# Patient Record
Sex: Male | Born: 1990 | Race: Black or African American | Hispanic: No | Marital: Single | Smoking: Current every day smoker
Health system: Southern US, Community
[De-identification: ages and names within clinical notes are randomized; demographics above are authoritative.]

---

## 2019-11-01 ENCOUNTER — Emergency Department: Payer: Self-pay

## 2019-11-01 ENCOUNTER — Other Ambulatory Visit: Payer: Self-pay

## 2019-11-01 ENCOUNTER — Encounter: Payer: Self-pay | Admitting: Emergency Medicine

## 2019-11-01 ENCOUNTER — Emergency Department
Admission: EM | Admit: 2019-11-01 | Discharge: 2019-11-01 | Disposition: A | Discharge status: Home / Self Care | Payer: Self-pay | Attending: Emergency Medicine | Admitting: Emergency Medicine

## 2019-11-01 DIAGNOSIS — F17210 Nicotine dependence, cigarettes, uncomplicated: Secondary | ICD-10-CM | POA: Insufficient documentation

## 2019-11-01 DIAGNOSIS — M79674 Pain in right toe(s): Secondary | ICD-10-CM | POA: Insufficient documentation

## 2019-11-01 LAB — URIC ACID: Uric Acid, Serum: 6.6 mg/dL (ref 3.7–8.6)

## 2019-11-01 MED ORDER — NAPROXEN 500 MG PO TABS
500.0000 mg | ORAL_TABLET | Freq: Once | ORAL | Status: AC
  Administered 2019-11-01: 500 mg via ORAL
  Filled 2019-11-01: qty 1

## 2019-11-01 MED ORDER — NAPROXEN 500 MG PO TABS: 500.0000 mg | ORAL_TABLET | Freq: Two times a day (BID) | ORAL | Status: AC

## 2019-11-01 NOTE — ED Triage Notes (Signed)
Pt to triage via w/c with no distress noted; pt reports painful rt great toe with no known injury or hx of same

## 2019-11-01 NOTE — Discharge Instructions (Addendum)
No objective findings for your complaint.  Advised to follow-up with podiatrist.

## 2019-11-01 NOTE — ED Provider Notes (Signed)
[facility] Emergency Department Provider Note   ____________________________________________   First MD Initiated Contact with Patient 11/01/19 [phone]     (approximate)  I have reviewed the triage vital signs and the nursing notes.   HISTORY  Chief Complaint Toe Pain    HPI Jose Barrett is a 29 y.o. male patient presents with 2 days of right great toe pain with no provocative incident.  Patient rates the pain as a 10/10.  Patient scribed pain is "achy".  No palliative measure for complaint.         History reviewed. No pertinent past medical history.  There are no problems to display for this patient.   History reviewed. No pertinent surgical history.  Prior to Admission medications   Medication Sig Start Date End Date Taking? Authorizing Provider  naproxen (NAPROSYN) 500 MG tablet Take 1 tablet (500 mg total) by mouth 2 (two) times daily with a meal. 11/01/19   Joni Reining, PA-C    Allergies Patient has no known allergies.  No family history on file.  Social History Social History   Tobacco Use  . Smoking status: Current Every Day Smoker  . Smokeless tobacco: Never Used  Substance Use Topics  . Alcohol use: Not on file  . Drug use: Not on file    Review of Systems Constitutional: No fever/chills Eyes: No visual changes. ENT: No sore throat. Cardiovascular: Denies chest pain. Respiratory: Denies shortness of breath. Gastrointestinal: No abdominal pain.  No nausea, no vomiting.  No diarrhea.  No constipation. Genitourinary: Negative for dysuria. Musculoskeletal: Great right toe pain.   Skin: Negative for rash. Neurological: Negative for headaches, focal weakness or numbness.   ____________________________________________   PHYSICAL EXAM:  VITAL SIGNS: ED Triage Vitals  Enc Vitals Group     BP 11/01/19 0503 127/65     Pulse Rate 11/01/19 0503 88     Resp 11/01/19 0503 20     Temp 11/01/19 0503 97.9 F (36.6 C)    Temp Source 11/01/19 0503 Oral     SpO2 11/01/19 0503 98 %     Weight --      Height 11/01/19 0503 6' (1.829 m)     Head Circumference --      Peak Flow --      Pain Score 11/01/19 0506 10     Pain Loc --      Pain Edu? --      Excl. in GC? --     Constitutional: Alert and oriented. Well appearing and in no acute distress. Hematological/Lymphatic/Immunilogical: No cervical lymphadenopathy. Cardiovascular: Normal rate, regular rhythm. Grossly normal heart sounds.  Good peripheral circulation. Respiratory: Normal respiratory effort.  No retractions. Lungs CTAB. Musculoskeletal: No obvious deformity or edema to the great right toe.  No erythema.   Neurologic:  Normal speech and language. No gross focal neurologic deficits are appreciated. No gait instability. Skin:  Skin is warm, dry and intact. No rash noted. Psychiatric: Mood and affect are normal. Speech and behavior are normal.  ____________________________________________   LABS (all labs ordered are listed, but only abnormal results are displayed)  Labs Reviewed  URIC ACID   ____________________________________________  EKG   ____________________________________________  RADIOLOGY  ED MD interpretation:    Official radiology report(s): DG Toe Great Right  Result Date: 11/01/2019 CLINICAL DATA:  Right great toe pain. No known injury. EXAM: RIGHT GREAT TOE COMPARISON:  None. FINDINGS: The joint spaces are maintained. No acute bony findings or arthropathic changes.  IMPRESSION: No acute bony findings. Electronically Signed   By: Marijo Sanes M.D.   On: 11/01/2019 06:52    ____________________________________________   PROCEDURES  Procedure(s) performed (including Critical Care):  Procedures   ____________________________________________   INITIAL IMPRESSION / ASSESSMENT AND PLAN / ED COURSE  As part of my medical decision making, I reviewed the following data within the [facility]        Patient presents with atraumatic right great toe pain for 2 days.  Discussed negative x-ray and uric acid results with patient.  Patient given discharge care instruction advised follow podiatry for definitive evaluation and treatment.  Take medication as directed.   Jose Barrett was evaluated in Emergency Department on 11/01/2019 for the symptoms described in the history of present illness. He was evaluated in the context of the global COVID-19 pandemic, which necessitated consideration that the patient might be at risk for infection with the SARS-CoV-2 virus that causes COVID-19. Institutional protocols and algorithms that pertain to the evaluation of patients at risk for COVID-19 are in a state of rapid change based on information released by regulatory bodies including the CDC and federal and state organizations. These policies and algorithms were followed during the patient's care in the ED.       ____________________________________________   FINAL CLINICAL IMPRESSION(S) / ED DIAGNOSES  Final diagnoses:  Great toe pain, right     ED Discharge Orders         Ordered    naproxen (NAPROSYN) 500 MG tablet  2 times daily with meals     11/01/19 0732           Note:  This document was prepared using Dragon voice recognition software and may include unintentional dictation errors.    Sable Feil, PA-C 11/01/19 4128    Carrie Mew, MD 11/01/19 1452

## 2020-01-21 ENCOUNTER — Ambulatory Visit: Payer: Self-pay | Attending: Internal Medicine

## 2020-01-21 ENCOUNTER — Other Ambulatory Visit: Payer: Self-pay

## 2020-01-21 DIAGNOSIS — Z23 Encounter for immunization: Secondary | ICD-10-CM

## 2020-01-21 NOTE — Progress Notes (Signed)
   Covid-19 Vaccination Clinic  Name:  Jose Barrett    MRN: [id number] DOB: 08-17-1990  01/21/2020  Jose Barrett was observed post Covid-19 immunization for 30 minutes based on pre-vaccination screening without incident. He was provided with Vaccine Information Sheet and instruction to access the V-Safe system.   Jose Barrett was instructed to call 911 with any severe reactions post vaccine: Marland Kitchen Difficulty breathing  . Swelling of face and throat  . A fast heartbeat  . A bad rash all over body  . Dizziness and weakness   Immunizations Administered    Name Date Dose VIS Date Route   Pfizer COVID-19 Vaccine 01/21/2020  8:11 AM 0.3 mL 09/07/2018 Intramuscular   Manufacturer: [employer], [employer]   Lot: [id number]   NDC: [address]-1

## 2020-02-07 ENCOUNTER — Ambulatory Visit: Payer: Self-pay | Attending: Internal Medicine

## 2020-02-07 DIAGNOSIS — Z23 Encounter for immunization: Secondary | ICD-10-CM

## 2020-02-07 NOTE — Progress Notes (Signed)
   Covid-19 Vaccination Clinic  Name:  Jose Barrett    MRN: [id number] DOB: 14-Jan-1991  02/07/2020  [name] was observed post Covid-19 immunization for 15 minutes without incident. He was provided with Vaccine Information Sheet and instruction to access the V-Safe system.   [name] was instructed to call 911 with any severe reactions post vaccine: Marland Kitchen Difficulty breathing  . Swelling of face and throat  . A fast heartbeat  . A bad rash all over body  . Dizziness and weakness   Immunizations Administered    Name Date Dose VIS Date Route   Pfizer COVID-19 Vaccine 02/07/2020  3:42 PM 0.3 mL 09/07/2018 Intramuscular   Manufacturer: [employer], [employer]   Lot: [id number]   NDC: [address]-0

## 2020-10-06 IMAGING — CR DG TOE GREAT 2+V*R*
3 series · 3 of 3 positions shown · non-contrast
Comparison: None.

CLINICAL DATA: Right great toe pain. No known injury.

EXAM:
RIGHT GREAT TOE

[toe ap]
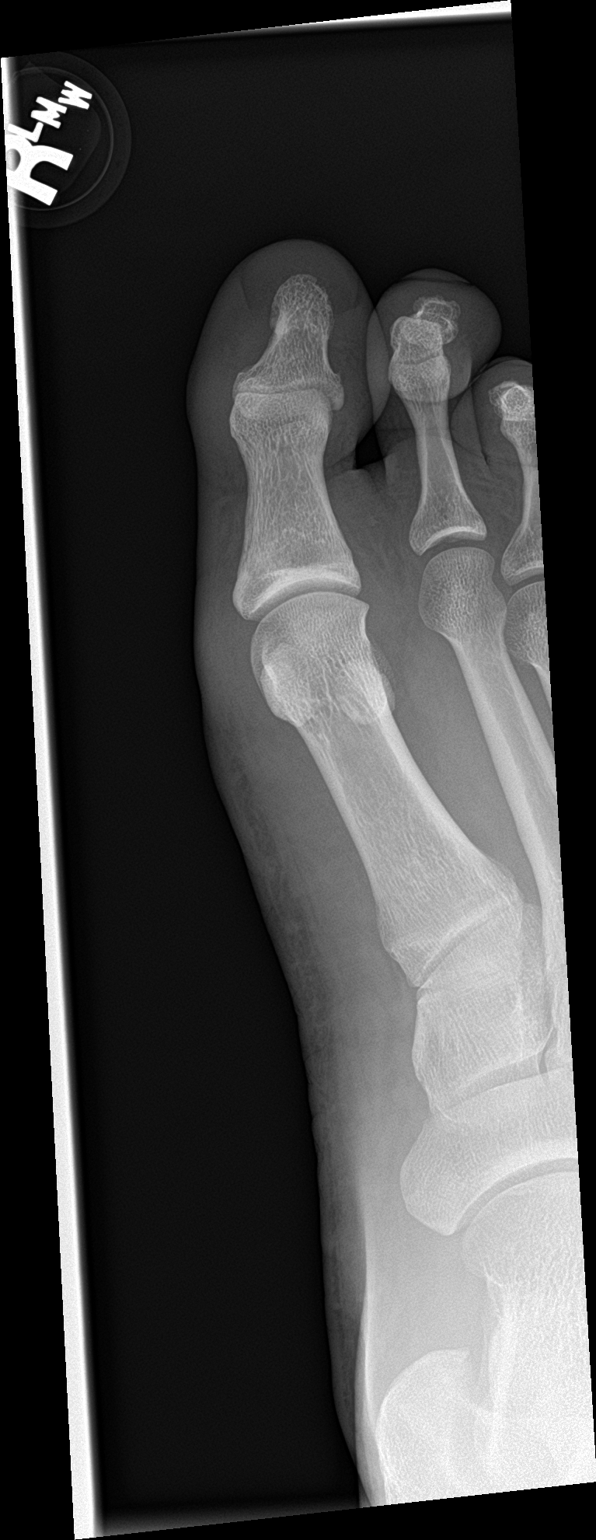

[toe obl]
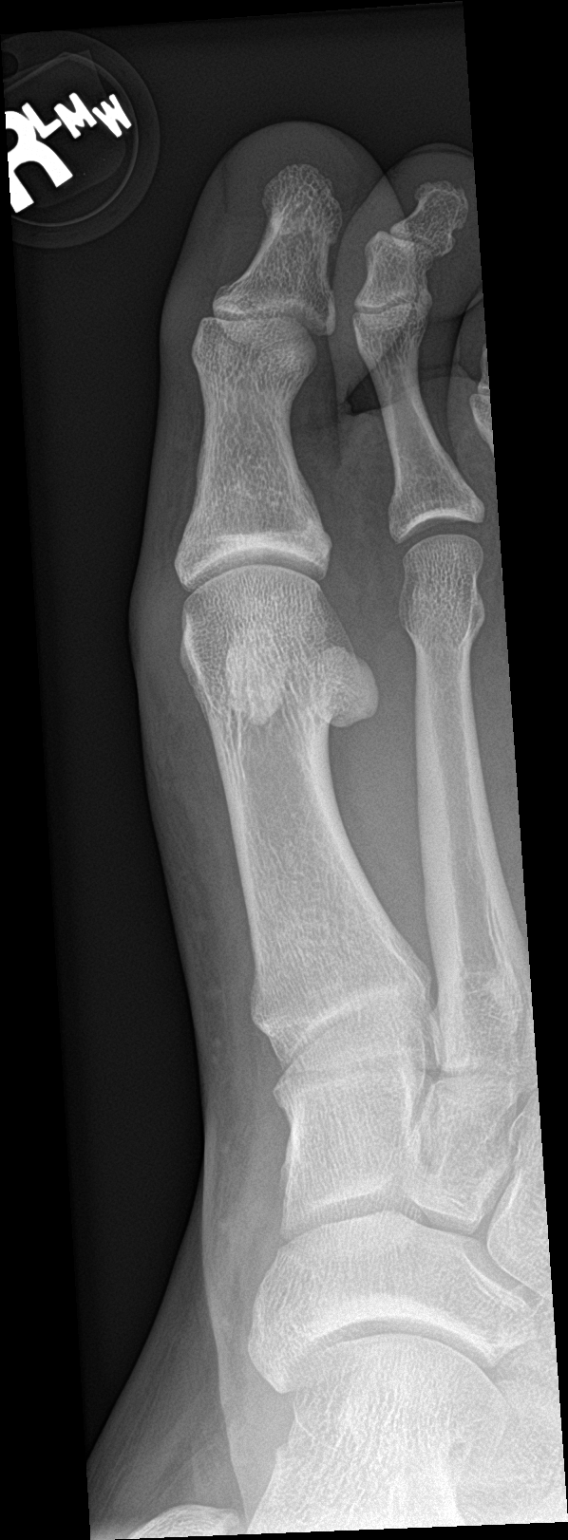

[toe lat]
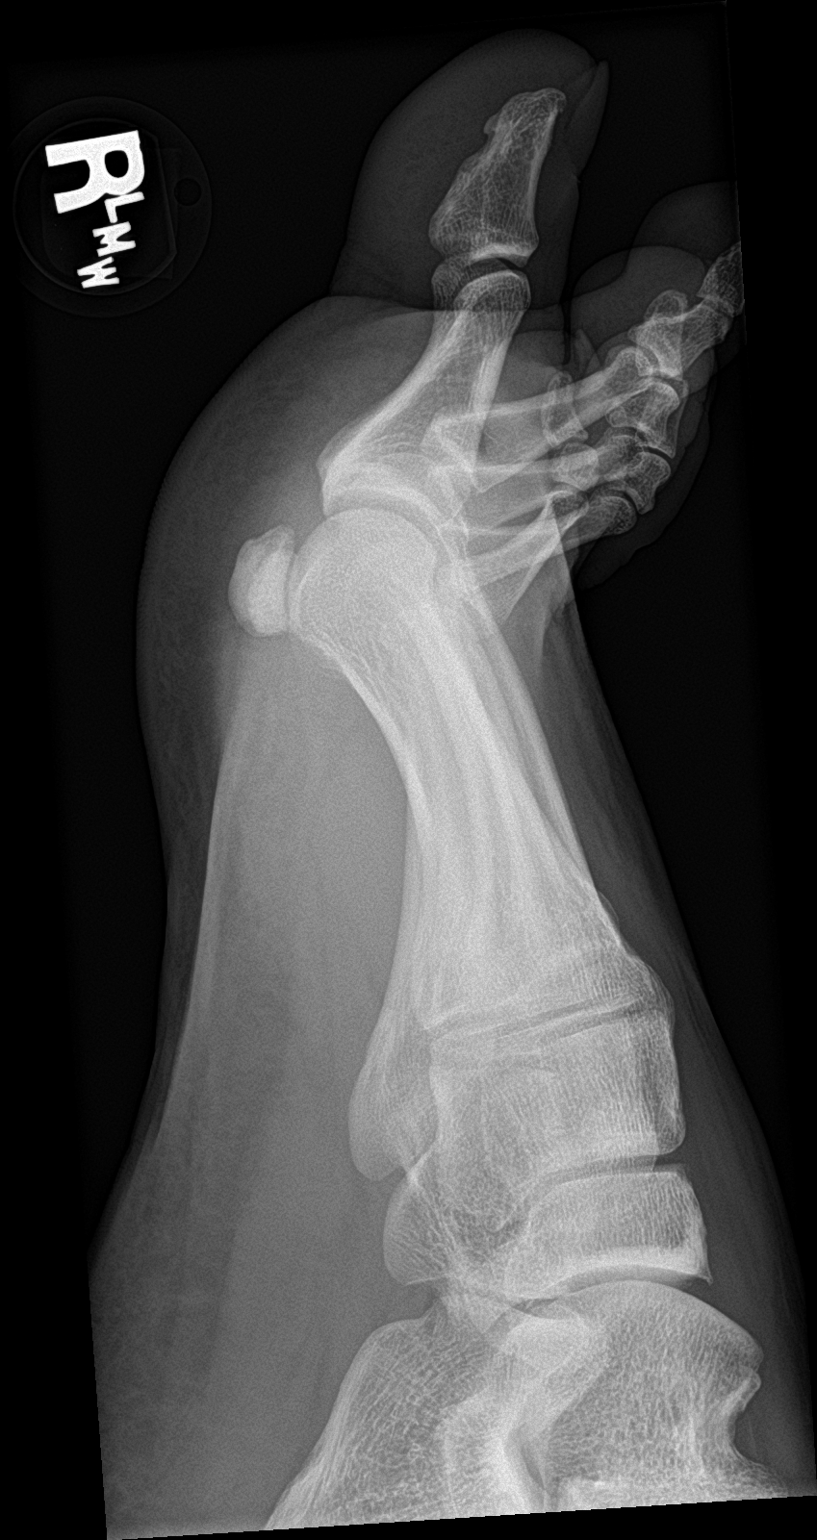

[3 of 3 positions shown; findings below may reference images not displayed]

FINDINGS: The joint spaces are maintained. No acute bony findings or
arthropathic changes.
IMPRESSION: No acute bony findings.

## 2023-06-05 ENCOUNTER — Encounter (HOSPITAL_COMMUNITY): Payer: Self-pay

## 2023-06-05 ENCOUNTER — Other Ambulatory Visit: Payer: Self-pay

## 2023-06-05 ENCOUNTER — Emergency Department (HOSPITAL_COMMUNITY)
Admission: EM | Admit: 2023-06-05 | Discharge: 2023-06-05 | Disposition: A | Discharge status: Home / Self Care | Payer: Self-pay | Attending: Emergency Medicine | Admitting: Emergency Medicine

## 2023-06-05 ENCOUNTER — Emergency Department (HOSPITAL_COMMUNITY): Payer: Self-pay

## 2023-06-05 DIAGNOSIS — R220 Localized swelling, mass and lump, head: Secondary | ICD-10-CM | POA: Insufficient documentation

## 2023-06-05 LAB — CBC WITH DIFFERENTIAL/PLATELET
Abs Immature Granulocytes: 0.01 10*3/uL (ref 0.00–0.07)
Basophils Absolute: 0 10*3/uL (ref 0.0–0.1)
Basophils Relative: 0 %
Eosinophils Absolute: 0.2 10*3/uL (ref 0.0–0.5)
Eosinophils Relative: 3 %
HCT: 39.1 % (ref 39.0–52.0)
Hemoglobin: 13.2 g/dL (ref 13.0–17.0)
Immature Granulocytes: 0 %
Lymphocytes Relative: 24 %
Lymphs Abs: 1.3 10*3/uL (ref 0.7–4.0)
MCH: 31 pg (ref 26.0–34.0)
MCHC: 33.8 g/dL (ref 30.0–36.0)
MCV: 91.8 fL (ref 80.0–100.0)
Monocytes Absolute: 0.4 10*3/uL (ref 0.1–1.0)
Monocytes Relative: 8 %
Neutro Abs: 3.5 10*3/uL (ref 1.7–7.7)
Neutrophils Relative %: 65 %
Platelets: 143 10*3/uL — ABNORMAL LOW (ref 150–400)
RBC: 4.26 MIL/uL (ref 4.22–5.81)
RDW: 13.2 % (ref 11.5–15.5)
WBC: 5.4 10*3/uL (ref 4.0–10.5)
nRBC: 0 % (ref 0.0–0.2)

## 2023-06-05 LAB — BASIC METABOLIC PANEL
Anion gap: 10 (ref 5–15)
BUN: 9 mg/dL (ref 6–20)
CO2: 21 mmol/L — ABNORMAL LOW (ref 22–32)
Calcium: 9 mg/dL (ref 8.9–10.3)
Chloride: 106 mmol/L (ref 98–111)
Creatinine, Ser: 1.01 mg/dL (ref 0.61–1.24)
GFR, Estimated: >60 mL/min (ref >60)
Glucose, Bld: 98 mg/dL (ref 70–99)
Potassium: 3.8 mmol/L (ref 3.5–5.1)
Sodium: 137 mmol/L (ref 135–145)

## 2023-06-05 MED ORDER — IOHEXOL 300 MG/ML  SOLN
75.0000 mL | Freq: Once | INTRAMUSCULAR | Status: AC | PRN
  Administered 2023-06-05: 75 mL via INTRAVENOUS

## 2023-06-05 MED ORDER — CLINDAMYCIN HCL 300 MG PO CAPS: 300.0000 mg | ORAL_CAPSULE | Freq: Three times a day (TID) | ORAL | 0 refills | Status: AC

## 2023-06-05 MED ORDER — OXYCODONE-ACETAMINOPHEN 5-325 MG PO TABS: 1.0000 | ORAL_TABLET | Freq: Four times a day (QID) | ORAL | 0 refills | Status: AC | PRN

## 2023-06-05 NOTE — Discharge Instructions (Signed)
You need to follow-up with the ENT doctor.  Call his office to make an appointment.  Take pain medication as directed.  Below is the CT scan report that you can review with your doctor.  1. Heterogeneous superficial up to 4.6 cm mass overlying the left  mandible with epicenter at the dermal/subdermal space and is  superficial to the platysma. Complex fluid internal density, and  mild regional inflammation. Underlying left mandible and dentition  remain normal. There is an enlarged, reactive appearing left level  1A lymph node (1.3 cm).  This is nonspecific with top differential considerations of  infectious phlegmon/abscess, and skin malignancy.   2. No other associated findings in the Neck. Other cervical lymph  node stations are normal. Ordinary paranasal sinus inflammation  which seems unrelated. And negative visible upper chest.

## 2023-06-05 NOTE — ED Provider Notes (Signed)
[facility] Emergency Department Provider Note MRN:  [id number]  Arrival date & time: 06/05/23     Chief Complaint   Abscess   History of Present Illness   Firmin Weyer is a 32 y.o. year-old male presents to the ED with chief complaint of mass to the left cheek.  States that he has had slow growing mass for about a month.  States that it has just recently become painful.  He denies fevers or chills.  It is worsened with palpation.  He states that he thought it was an ingrown hair at first.  Denies any treatments PTA.  History provided by patient.   Review of Systems  Pertinent positive and negative review of systems noted in HPI.    Physical Exam   Vitals:   06/05/23 0553 06/05/23 0600  BP:  108/72  Pulse:  66  Resp:  16  Temp: 98.1 F (36.7 C) 98 F (36.7 C)  SpO2:  100%    CONSTITUTIONAL:  non toxic-appearing, NAD NEURO:  Alert and oriented x 3, CN 3-12 grossly intact EYES:  eyes equal and reactive ENT/NECK:  Supple, no stridor, golf ball sized mass to left cheek/mandible, no evidence of dental infection CARDIO:  normal rate, regular rhythm, appears well-perfused  PULM:  No respiratory distress,  GI/GU:  non-distended,  MSK/SPINE:  No gross deformities, no edema, moves all extremities  SKIN:  no rash, atraumatic   *Additional and/or pertinent findings included in MDM below  Diagnostic and Interventional Summary    EKG Interpretation Date/Time:    Ventricular Rate:    PR Interval:    QRS Duration:    QT Interval:    QTC Calculation:   R Axis:      Text Interpretation:         Labs Reviewed  CBC WITH DIFFERENTIAL/PLATELET - Abnormal; Notable for the following components:      Result Value   Platelets 143 (*)    All other components within normal limits  BASIC METABOLIC PANEL - Abnormal; Notable for the following components:   CO2 21 (*)    All other components within normal limits    CT Soft Tissue Neck W Contrast  Final  Result      Medications  iohexol (OMNIPAQUE) 300 MG/ML solution 75 mL (75 mLs Intravenous Contrast Given 06/05/23 0536)     Procedures  /  Critical Care Procedures  ED Course and Medical Decision Making  I have reviewed the triage vital signs, the nursing notes, and pertinent available records from the EMR.  Social Determinants Affecting Complexity of Care: Patient has no clinically significant social determinants affecting this chief complaint..   ED Course:    Medical Decision Making Patient here with mass that has been growing for the past month to the left mandible.  No dental pain or infection.  He is afebrile.  No leukocytosis.  It doesn't look infected.   CT ordered and shows heterogenous collection.  Differential includes abscess, phlegmon, malignancy.  Will consult with ENT.  I discussed the plan with the patient.  He will follow-up as directed.  Will start abx just in case of abscess.  Will give some pain meds.  Amount and/or Complexity of Data Reviewed Labs: ordered. Radiology: ordered.  Risk Prescription drug management.         Consultants: I consulted with Dr. Pollyann Kennedy, with ENT, who recommends that patient follow-up in clinic.  Might need biopsy.  .   Treatment and Plan: I  considered admission due to patient's initial presentation, but after considering the examination and diagnostic results, patient will not require admission and can be discharged with outpatient follow-up.    Final Clinical Impressions(s) / ED Diagnoses     ICD-10-CM   1. Mandibular mass  R22.0       ED Discharge Orders          Ordered    oxyCODONE-acetaminophen (PERCOCET) 5-325 MG tablet  Every 6 hours PRN        06/05/23 0624    clindamycin (CLEOCIN) 300 MG capsule  3 times daily        06/05/23 1191              Discharge Instructions Discussed with and Provided to Patient:     Discharge Instructions      You need to follow-up with the ENT doctor.  Call  his office to make an appointment.  Take pain medication as directed.  Below is the CT scan report that you can review with your doctor.  1. Heterogeneous superficial up to 4.6 cm mass overlying the left  mandible with epicenter at the dermal/subdermal space and is  superficial to the platysma. Complex fluid internal density, and  mild regional inflammation. Underlying left mandible and dentition  remain normal. There is an enlarged, reactive appearing left level  1A lymph node (1.3 cm).  This is nonspecific with top differential considerations of  infectious phlegmon/abscess, and skin malignancy.   2. No other associated findings in the Neck. Other cervical lymph  node stations are normal. Ordinary paranasal sinus inflammation  which seems unrelated. And negative visible upper chest.        Roxy Horseman, PA-C 06/05/23 4782    Tilden Fossa, MD 06/05/23 2348

## 2023-06-05 NOTE — ED Triage Notes (Signed)
Approximate golf ball sized growth left of the chin pt says has been problematic and growing for 2 months.   Recently making it difficult to swallow.   Denies any fevers or drainage.

## 2023-06-05 NOTE — ED Notes (Signed)
Patient transported to CT
